# Patient Record
Sex: Male | Born: 1960 | Race: Asian | Hispanic: No | Marital: Married | Smoking: Current some day smoker
Health system: Southern US, Community
[De-identification: ages and names within clinical notes are randomized; demographics above are authoritative.]

---

## 2019-01-02 ENCOUNTER — Telehealth: Payer: BLUE CROSS/BLUE SHIELD | Admitting: Nurse Practitioner

## 2019-01-02 ENCOUNTER — Telehealth: Payer: BLUE CROSS/BLUE SHIELD | Admitting: Physician Assistant

## 2019-01-02 DIAGNOSIS — M7989 Other specified soft tissue disorders: Secondary | ICD-10-CM

## 2019-01-02 DIAGNOSIS — Z20822 Contact with and (suspected) exposure to covid-19: Secondary | ICD-10-CM

## 2019-01-02 DIAGNOSIS — R6889 Other general symptoms and signs: Principal | ICD-10-CM

## 2019-01-02 MED ORDER — BENZONATATE 100 MG PO CAPS: 100.0000 mg | ORAL_CAPSULE | Freq: Three times a day (TID) | ORAL | 0 refills | Status: AC | PRN

## 2019-01-02 NOTE — Progress Notes (Signed)
Based on what you shared with me, I feel your condition warrants further evaluation and I recommend that you be seen for a face to face office visit.     NOTE: If you entered your credit card information for this eVisit, you will not be charged. You may see a "hold" on your card for the $35 but that hold will drop off and you will not have a charge processed.  If you are having a true medical emergency please call 911.  If you need an urgent face to face visit, [facility] has four urgent care centers for your convenience.    PLEASE NOTE: THE [facility] LOCATIONS AND URGENT CARE CLINICS DO NOT HAVE THE TESTING FOR CORONAVIRUS COVID19 AVAILABLE.  IF YOU FEEL YOU NEED THIS TEST YOU MUST GO TO A TRIAGE LOCATION AT [place]  The following sites will take your insurance:  . [facility] [place] a Provider at this Location  [address] [place], [place] [address] . 10 am to 8 pm Monday-Friday . 12 pm to 8 pm Saturday-Sunday   . [facility] at [place] a Provider at this Location  [address], [place] [employer], [place] [address] . 8 am to 8 pm Monday-Friday . 9 am to 6 pm Saturday . 11 am to 6 pm Sunday   . [facility] at [place] Get Driving Directions  [address] [place], [place] [address] . 8 am to 8 pm Monday-Friday . 8 am to 4 pm Saturday-Sunday   Your e-visit answers were reviewed by a board certified advanced clinical practitioner to complete your personal care plan.  Thank you for using e-Visits.   I have spent 5 minutes in review of e-visit questionnaire, review and updating patient chart, medical decision making and response to patient.    Tenna Delaine, PA-C

## 2019-01-02 NOTE — Progress Notes (Signed)
E-Visit for Corona Virus Screening  Based on your current symptoms, it seems unlikely that your symptoms are related to the Coronavirus.   Coronavirus disease 2019 (COVID-19) is a respiratory illness that can spread from person to person. The virus that causes COVID-19 is a new virus that was first identified in the country of [place] but is now found in multiple other countries and has spread to the [place].  Symptoms associated with the virus are mild to severe fever, cough, and shortness of breath. There is currently no vaccine to protect against COVID-19, and there is no specific antiviral treatment for the virus.   To be considered HIGH RISK for Coronavirus (COVID-19), you have to meet the following criteria:  . Traveled to [place], [place], [place], [place] or [place]; or in the [place] to [place], [place], [place], or [place]; and have fever, cough, and shortness of breath within the last 2 weeks of travel OR  . Been in close contact with a person diagnosed with COVID-19 within the last 2 weeks and have fever, cough, and shortness of breath  . IF YOU DO NOT MEET THESE CRITERIA, YOU ARE CONSIDERED LOW RISK FOR COVID-19.   It is vitally important that if you feel that you have an infection such as this virus or any other virus that you stay home and away from places where you may spread it to others.  You should self-quarantine for 14 days if you have symptoms that could potentially be coronavirus and avoid contact with people age 38 and older.   You can use medication such as A prescription cough medication called Tessalon Perles 100 mg. You may take 1-2 capsules every 8 hours as needed for cough  You may also take acetaminophen (Tylenol) as needed for fever.   Reduce your risk of any infection by using the same precautions used for avoiding the common cold or flu:  Marland Kitchen Wash your hands often with soap and warm water for at least 20 seconds.  If soap and water are not readily  available, use an alcohol-based hand sanitizer with at least 60% alcohol.  . If coughing or sneezing, cover your mouth and nose by coughing or sneezing into the elbow areas of your shirt or coat, into a tissue or into your sleeve (not your hands). . Avoid shaking hands with others and consider head nods or verbal greetings only. . Avoid touching your eyes, nose, or mouth with unwashed hands.  . Avoid close contact with people who are sick. . Avoid places or events with large numbers of people in one location, like concerts or sporting events. . Carefully consider travel plans you have or are making. . If you are planning any travel outside or inside the [place], visit the CDC's Travelers' Health webpage for the latest health notices. . If you have some symptoms but not all symptoms, continue to monitor at home and seek medical attention if your symptoms worsen. . If you are having a medical emergency, call 911.  HOME CARE . Only take medications as instructed by your medical team. . Drink plenty of fluids and get plenty of rest. . A steam or ultrasonic humidifier can help if you have congestion.   GET HELP RIGHT AWAY IF: . You develop worsening fever. . You become short of breath . You cough up blood. . Your symptoms become more severe MAKE SURE YOU   Understand these instructions.  Will watch your condition.  Will get help  right away if you are not doing well or get worse.  Your e-visit answers were reviewed by a board certified advanced clinical practitioner to complete your personal care plan.  Depending on the condition, your plan could have included both over the counter or prescription medications.  If there is a problem please reply once you have received a response from your provider. Your safety is important to [place].  If you have drug allergies check your prescription carefully.    You can use MyChart to ask questions about today's visit, request a non-urgent call back, or ask for a  work or school excuse for 24 hours related to this e-Visit. If it has been greater than 24 hours you will need to follow up with your provider, or enter a new e-Visit to address those concerns. You will get an e-mail in the next two days asking about your experience.  I hope that your e-visit has been valuable and will speed your recovery. Thank you for using e-visits.  5-10 minutes spent reviewing and documenting in chart.

## 2019-03-09 ENCOUNTER — Encounter: Payer: Self-pay | Admitting: Gastroenterology

## 2019-03-30 ENCOUNTER — Other Ambulatory Visit: Payer: Self-pay

## 2019-04-07 ENCOUNTER — Encounter: Payer: Self-pay | Admitting: Gastroenterology

## 2019-04-13 ENCOUNTER — Encounter: Payer: BLUE CROSS/BLUE SHIELD | Admitting: Gastroenterology

## 2019-12-21 ENCOUNTER — Ambulatory Visit: Payer: Self-pay | Attending: Internal Medicine

## 2020-04-26 ENCOUNTER — Other Ambulatory Visit: Payer: Self-pay | Admitting: Family

## 2020-04-26 DIAGNOSIS — R109 Unspecified abdominal pain: Secondary | ICD-10-CM

## 2020-04-26 DIAGNOSIS — R221 Localized swelling, mass and lump, neck: Secondary | ICD-10-CM

## 2020-04-26 DIAGNOSIS — R1013 Epigastric pain: Secondary | ICD-10-CM

## 2020-05-17 ENCOUNTER — Ambulatory Visit
Admission: RE | Admit: 2020-05-17 | Discharge: 2020-05-17 | Disposition: A | Discharge status: Home / Self Care | Payer: 59 | Source: Ambulatory Visit | Attending: Family | Admitting: Family

## 2020-05-17 DIAGNOSIS — R109 Unspecified abdominal pain: Secondary | ICD-10-CM

## 2020-05-17 DIAGNOSIS — R221 Localized swelling, mass and lump, neck: Secondary | ICD-10-CM

## 2020-05-17 DIAGNOSIS — R1013 Epigastric pain: Secondary | ICD-10-CM

## 2020-05-19 ENCOUNTER — Other Ambulatory Visit (HOSPITAL_COMMUNITY): Payer: Self-pay | Admitting: "Women's Health Care

## 2020-05-19 ENCOUNTER — Other Ambulatory Visit: Payer: Self-pay | Admitting: "Women's Health Care

## 2020-05-23 ENCOUNTER — Other Ambulatory Visit (HOSPITAL_COMMUNITY): Payer: Self-pay | Admitting: "Women's Health Care

## 2020-05-23 ENCOUNTER — Other Ambulatory Visit: Payer: Self-pay | Admitting: "Women's Health Care

## 2020-05-23 DIAGNOSIS — R1013 Epigastric pain: Secondary | ICD-10-CM

## 2020-05-23 DIAGNOSIS — M7989 Other specified soft tissue disorders: Secondary | ICD-10-CM

## 2020-06-02 ENCOUNTER — Telehealth: Payer: Self-pay | Admitting: Gastroenterology

## 2020-06-02 NOTE — Telephone Encounter (Signed)
Spoke with patient, he states that he received a call regarding [place] results. Advised that he would have to call Judieth Keens, [place] office as she is the one who ordered the imaging. Pt requested that I transfer the call and advised that I am unable because we are not located at the same office. Pt verbalized understanding.

## 2020-07-18 ENCOUNTER — Ambulatory Visit (HOSPITAL_COMMUNITY): Payer: 59

## 2020-07-21 ENCOUNTER — Ambulatory Visit (HOSPITAL_COMMUNITY)
Admission: RE | Admit: 2020-07-21 | Discharge: 2020-07-21 | Disposition: A | Discharge status: Home / Self Care | Payer: 59 | Source: Ambulatory Visit | Attending: "Women's Health Care | Admitting: "Women's Health Care

## 2020-07-21 ENCOUNTER — Other Ambulatory Visit: Payer: Self-pay

## 2020-07-21 DIAGNOSIS — M7989 Other specified soft tissue disorders: Secondary | ICD-10-CM | POA: Insufficient documentation

## 2020-07-21 MED ORDER — IOHEXOL 300 MG/ML  SOLN
75.0000 mL | Freq: Once | INTRAMUSCULAR | Status: AC | PRN
  Administered 2020-07-21: 75 mL via INTRAVENOUS

## 2020-07-26 ENCOUNTER — Ambulatory Visit (HOSPITAL_COMMUNITY): Payer: 59

## 2020-08-10 ENCOUNTER — Ambulatory Visit (HOSPITAL_COMMUNITY): Payer: 59

## 2020-08-23 ENCOUNTER — Encounter (HOSPITAL_COMMUNITY): Payer: 59

## 2020-09-05 ENCOUNTER — Other Ambulatory Visit: Payer: Self-pay

## 2020-09-05 ENCOUNTER — Ambulatory Visit (HOSPITAL_COMMUNITY)
Admission: RE | Admit: 2020-09-05 | Discharge: 2020-09-05 | Disposition: A | Discharge status: Home / Self Care | Payer: 59 | Source: Ambulatory Visit | Attending: "Women's Health Care | Admitting: "Women's Health Care

## 2020-09-05 DIAGNOSIS — R1013 Epigastric pain: Secondary | ICD-10-CM | POA: Diagnosis not present

## 2020-09-05 MED ORDER — TECHNETIUM TC 99M MEBROFENIN IV KIT
5.1000 | PACK | Freq: Once | INTRAVENOUS | Status: AC | PRN
  Administered 2020-09-05: 09:00:00 5.1 via INTRAVENOUS

## 2021-04-04 ENCOUNTER — Ambulatory Visit: Payer: 59

## 2021-04-04 ENCOUNTER — Encounter: Payer: 59 | Admitting: Podiatry

## 2021-04-04 DIAGNOSIS — M722 Plantar fascial fibromatosis: Secondary | ICD-10-CM

## 2021-04-04 NOTE — Progress Notes (Signed)
This encounter was created in error - please disregard.

## 2021-04-05 ENCOUNTER — Encounter: Payer: Self-pay | Admitting: Podiatry

## 2021-09-25 ENCOUNTER — Telehealth: Payer: Self-pay | Admitting: Physician Assistant

## 2021-09-25 NOTE — Telephone Encounter (Signed)
Pt's wife called in to schedule appt from 12/30 referral. Scheduled appt for next available. She is aware of appt date and time.

## 2021-09-27 ENCOUNTER — Telehealth: Payer: Self-pay | Admitting: Hematology and Oncology

## 2021-09-27 NOTE — Telephone Encounter (Signed)
R/s pt's new hem appt per Cassie's 1/18 secure chat. Spoke to pt's wife who is aware of appt date and time.

## 2021-10-05 ENCOUNTER — Other Ambulatory Visit: Payer: Self-pay

## 2021-10-05 ENCOUNTER — Encounter: Payer: Self-pay | Admitting: Physician Assistant

## 2021-10-09 NOTE — Progress Notes (Signed)
[facility] CONSULT NOTE  Patient Care Team: Patient, No Pcp Per (Inactive) as PCP - General (General Practice)  CHIEF COMPLAINTS/PURPOSE OF CONSULTATION:  Newly diagnosed MGUS  HISTORY OF PRESENTING ILLNESS:  Jared Andrews 61 y.o. male is here because of recent diagnosis of MGUS. He presents to the clinic today for initial evaluation and discussion of treatment options.  He was being followed by nephrology for proteinuria and the extensive work-up was performed which included serum protein after pheresis and kappa lambda ratio.  He was referred to [place] because of findings of elevated monoclonal protein and suspicion of MGUS.  He tells me that he retired from [employer] over the past 3 months and does not have any symptoms or concerns.  I reviewed his records extensively and collaborated the history with the patient.  MEDICAL HISTORY:  Proteinuria  SURGICAL HISTORY: No prior surgeries  SOCIAL HISTORY: Tobacco abuse, alcohol use, and denies recreational drug use He is married and his wife brought him Social History   Socioeconomic History   Marital status: Married    Spouse name: Not on file   Number of children: Not on file   Years of education: Not on file   Highest education level: Not on file  Occupational History   Not on file  Tobacco Use   Smoking status: Not on file   Smokeless tobacco: Not on file  Substance and Sexual Activity   Alcohol use: Not on file   Drug use: Not on file   Sexual activity: Not on file  Other Topics Concern   Not on file  Social History Narrative   Not on file   Social Determinants of Health   Financial Resource Strain: Not on file  Food Insecurity: Not on file  Transportation Needs: Not on file  Physical Activity: Not on file  Stress: Not on file  Social Connections: Not on file  Intimate Partner Violence: Not on file    FAMILY HISTORY: No family history of blood cancers  ALLERGIES:  has no allergies on  file.  MEDICATIONS:  Current Outpatient Medications  Medication Sig Dispense Refill   benzonatate (TESSALON PERLES) 100 MG capsule Take 1 capsule (100 mg total) by mouth 3 (three) times daily as needed. 20 capsule 0   No current facility-administered medications for this visit.    REVIEW OF SYSTEMS:   Constitutional: Denies fevers, chills or abnormal night sweats Eyes: Denies blurriness of vision, double vision or watery eyes Ears, nose, mouth, throat, and face: Denies mucositis or sore throat Respiratory: Denies cough, dyspnea or wheezes Cardiovascular: Denies palpitation, chest discomfort or lower extremity swelling Gastrointestinal:  Denies nausea, heartburn or change in bowel habits Skin: Denies abnormal skin rashes Lymphatics: Denies new lymphadenopathy or easy bruising Neurological:Denies numbness, tingling or new weaknesses Behavioral/Psych: Mood is stable, no new changes  All other systems were reviewed with the patient and are negative.  PHYSICAL EXAMINATION: ECOG PERFORMANCE STATUS: 0 - Asymptomatic  Vitals:   10/10/21 1525  BP: 132/77  Pulse: 77  Resp: 18  Temp: 97.7 F (36.5 C)  SpO2: 99%   Filed Weights   10/10/21 1525  Weight: 172 lb (78 kg)       ASSESSMENT AND PLAN:  MGUS (monoclonal gammopathy of unknown significance) Lab review: 03/27/2021: M spike: 0.3 g (IgG lambda), hemoglobin 14.3, IgA 425, IgG 1336, 08/23/2021: M protein 0.4 g, Kappa 357, lambda 33.4, ratio 10.69, IgG 1437 creatinine 1.11, albumin 4.5,, calcium 9.6  Work-up was performed for minimal  proteinuria.  Counseling: I discussed with the patient the spectrum of disorders from MGUS to multiple myeloma. We discussed the role of plasma cells in producing immunoglobulins. We discussed structure of immunoglobulins on how they make up the heavy chains and the light chains. The light chains are Kappa and lambda. I discussed the difference between MGUS and multiple myeloma. MGUS is characterized  by elevation monoclonal protein without any end organ damage. Multiple myeloma is associated with elevation monoclonal protein and end organ damage (hypercalcemia, renal dysfunction, anemia, bone lytic lesions ) along with a bone marrow showing greater than 10% plasma cells.  Surveillance: Since every 49-monthelectrophoresis results did not show any major change, we can now do blood work once a year.  Smoking cessation counseling: Discussed with the patient that quitting smoking and exercising regularly would be important for his overall health. Return to clinic in 1 year for labs and follow-up   All questions were answered. The patient knows to call the clinic with any problems, questions or concerns.   VRulon Eisenmenger MD, MPH 10/10/2021    I, KThana Ates am acting as scribe for VNicholas Lose MD.  I have reviewed the above documentation for accuracy and completeness, and I agree with the above.

## 2021-10-10 ENCOUNTER — Other Ambulatory Visit: Payer: Self-pay

## 2021-10-10 ENCOUNTER — Inpatient Hospital Stay: Payer: 59 | Attending: Physician Assistant | Admitting: Hematology and Oncology

## 2021-10-10 DIAGNOSIS — F17200 Nicotine dependence, unspecified, uncomplicated: Secondary | ICD-10-CM | POA: Diagnosis not present

## 2021-10-10 DIAGNOSIS — D472 Monoclonal gammopathy: Secondary | ICD-10-CM | POA: Insufficient documentation

## 2021-10-10 NOTE — Assessment & Plan Note (Signed)
Lab review: 03/27/2021: M spike: 0.3 g (IgG lambda), hemoglobin 14.3, IgA 425, IgG 1336, 08/23/2021: M protein 0.4 g, Kappa 357, lambda 33.4, ratio 10.69, IgG 1437 creatinine 1.11, albumin 4.5,, calcium 9.6  Work-up was performed for minimal proteinuria.  Counseling: I discussed with the patient the spectrum of disorders from MGUS to multiple myeloma. We discussed the role of plasma cells in producing immunoglobulins. We discussed structure of immunoglobulins on how they make up the heavy chains and the light chains. The light chains are Kappa and lambda. I discussed the difference between MGUS and multiple myeloma. MGUS is characterized by elevation monoclonal protein without any end organ damage. Multiple myeloma is associated with elevation monoclonal protein and end organ damage (hypercalcemia, renal dysfunction, anemia, bone lytic lesions ) along with a bone marrow showing greater than 10% plasma cells.  Workup recommended: Bone survey Return to clinic in 1 year for labs and follow-up

## 2021-11-14 IMAGING — CT CT NECK W/ CM
4 series · 15 of 33 positions shown, 18 images · IV contrast (omnipaque)
Comparison: None.

CLINICAL DATA: Soft tissue nodule

EXAM:
CT NECK WITH CONTRAST
TECHNIQUE: Multidetector CT imaging of the neck was performed using the
standard protocol following the bolus administration of intravenous
contrast.
CONTRAST:  75mL OMNIPAQUE IOHEXOL 300 MG/ML  SOLN

[Series 3: neck 2.0 st · axial · 0.36mm/px · z∈[-360,-224]mm · 5 of 104 slices shown, 7 images]
[im 18/104  soft-tissue]
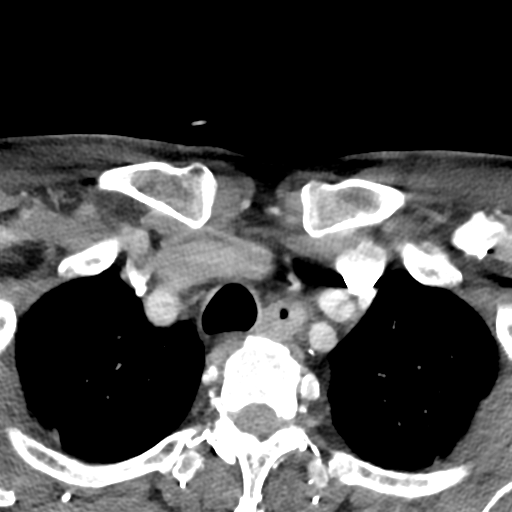
[im 18/104  bone]
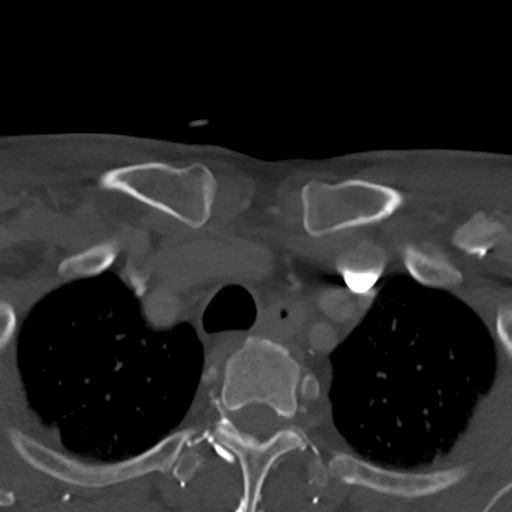
[im 35/104  bone]
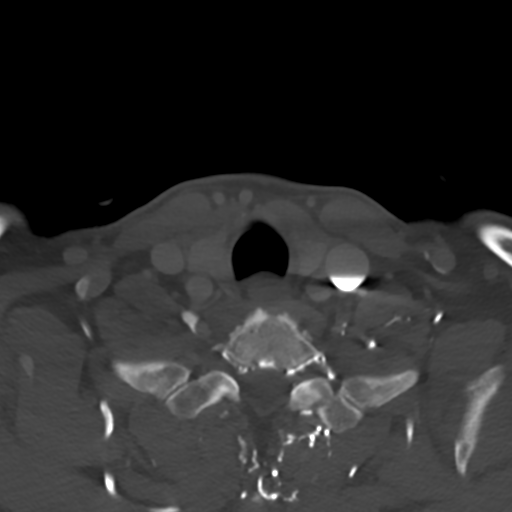
[im 52/104  bone]
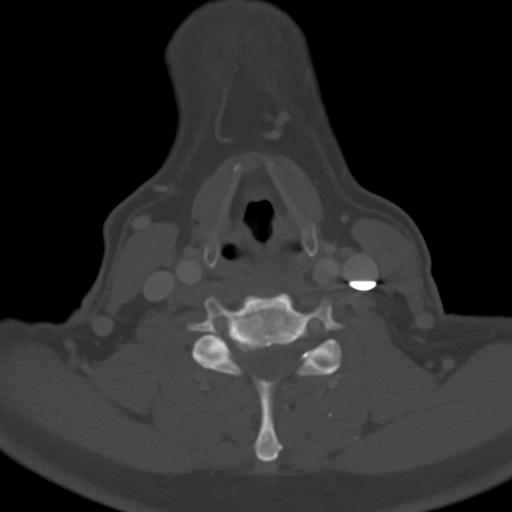
[im 69/104  bone]
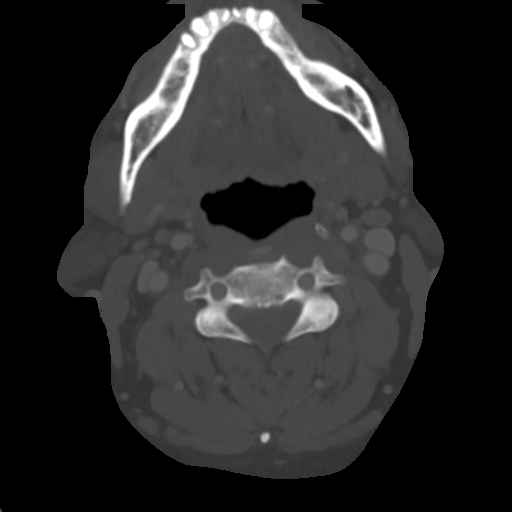
[im 86/104  soft-tissue]
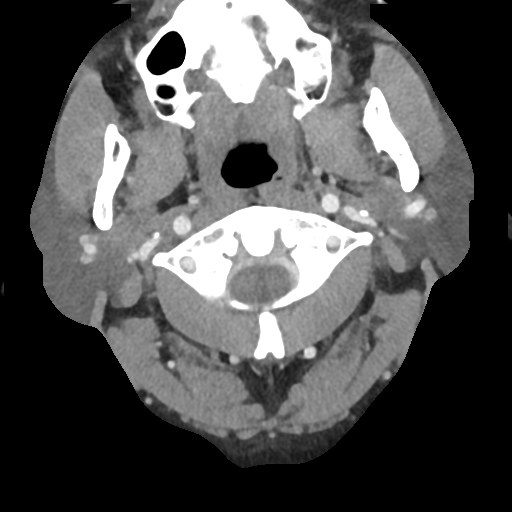
[im 86/104  bone]
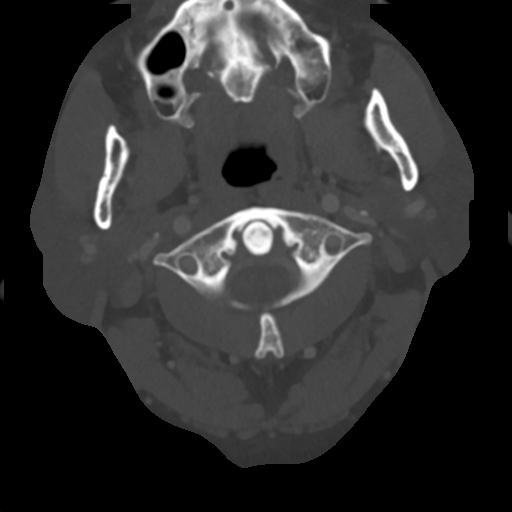

[Series 5: sagittal · sagittal · 0.40mm/px · 5 of 101 slices shown, 6 images]
[im 34/101  bone]
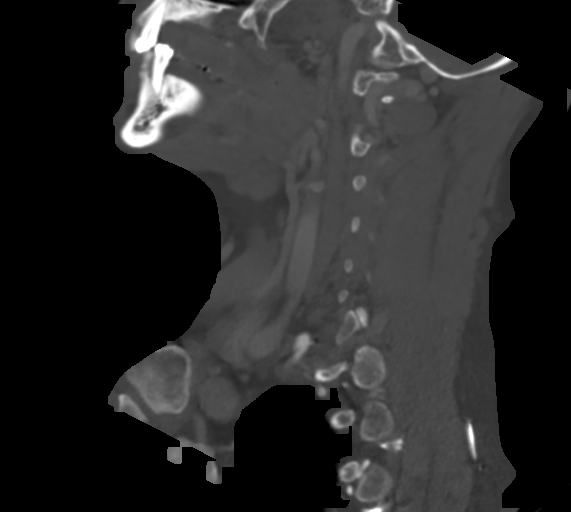
[im 42/101  bone]
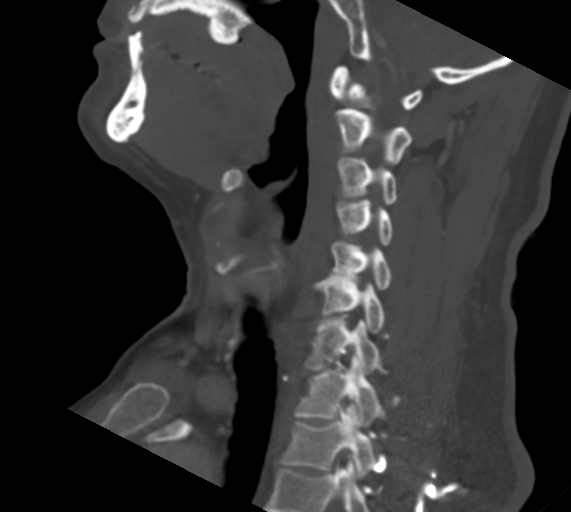
[im 51/101  soft-tissue]
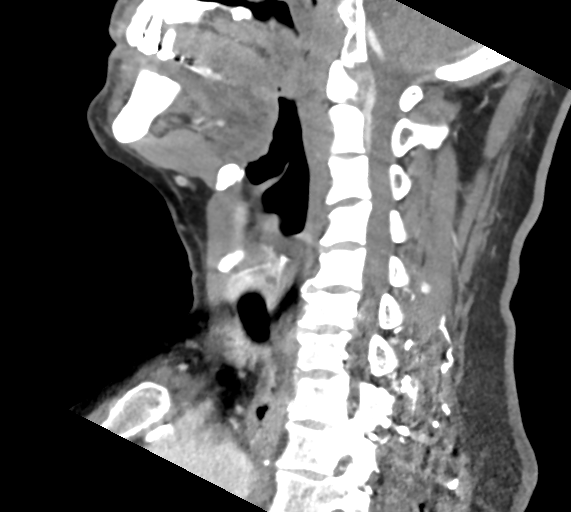
[im 51/101  bone]
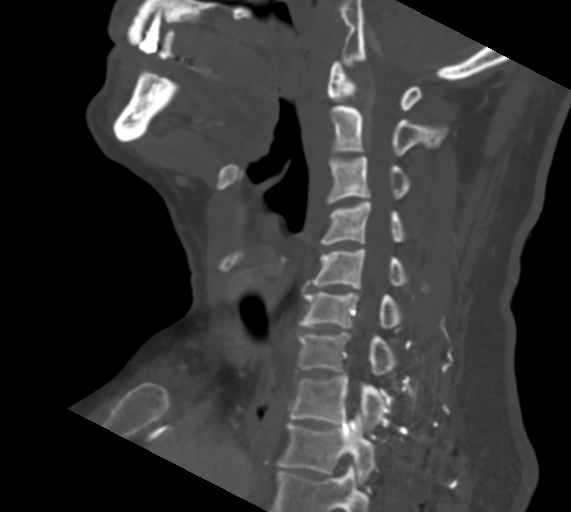
[im 59/101  bone]
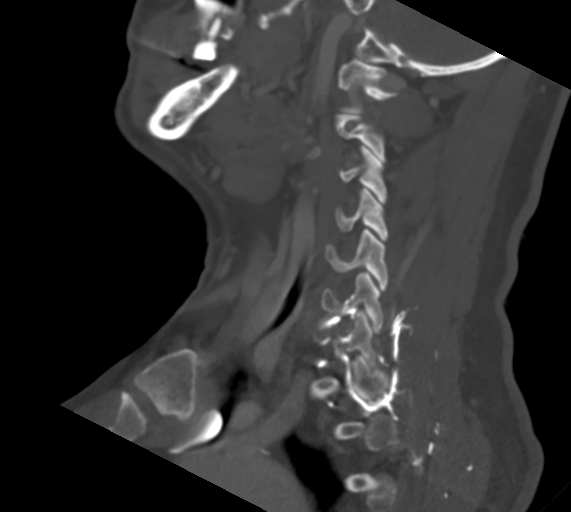
[im 67/101  bone]
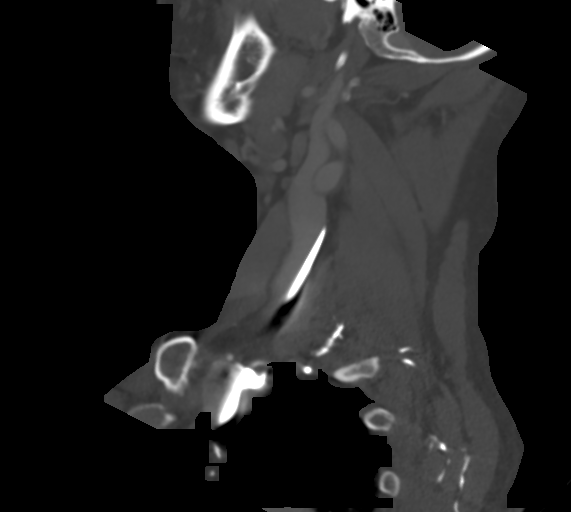

[Series 6: coronal · coronal · 0.40mm/px · 3 of 101 slices shown]
[im 21/101  bone]
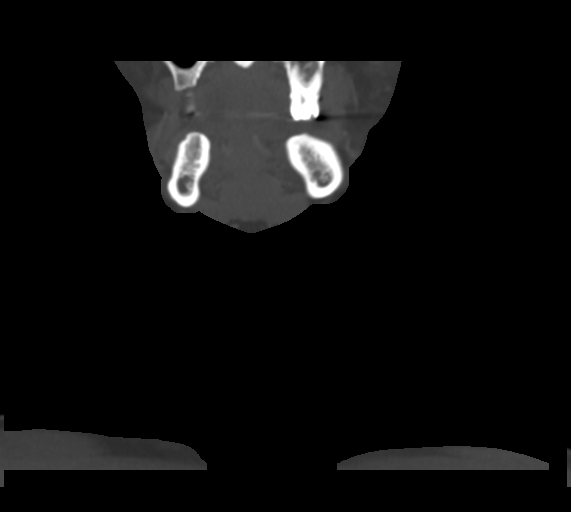
[im 41/101  bone]
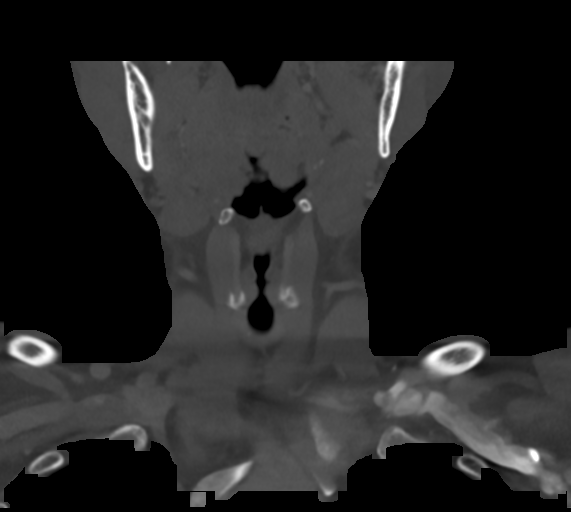
[im 61/101  bone]
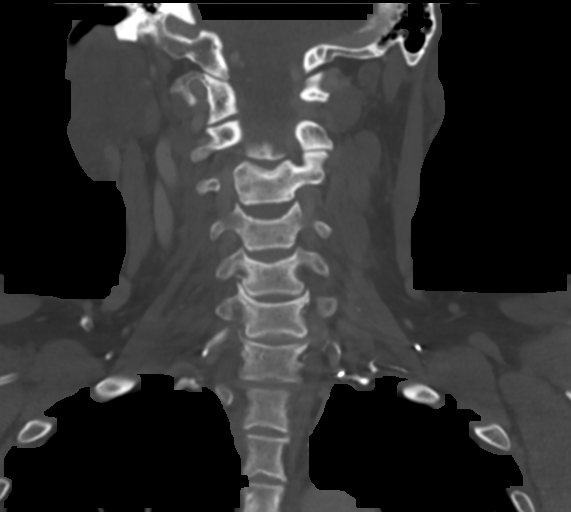

[Series 7: orthogonal · axial · 0.39mm/px · z∈[-358,-324]mm · 2 of 103 slices shown]
[im 18/103  bone]
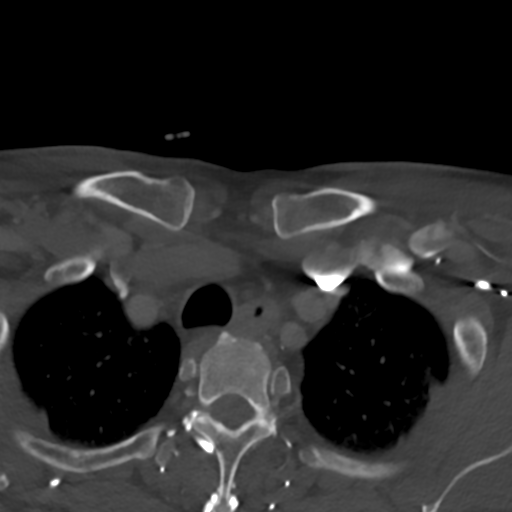
[im 35/103  bone]
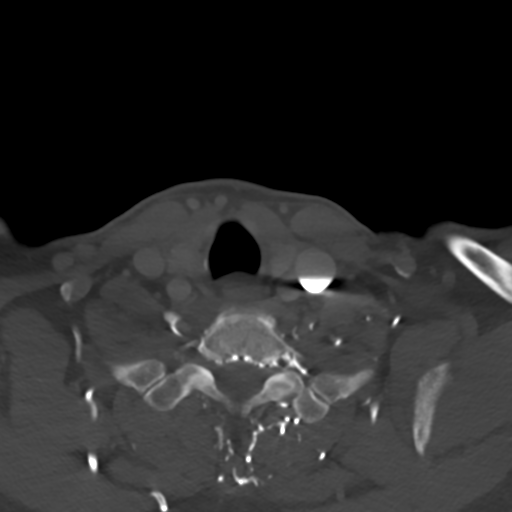

[15 of 33 positions shown; findings below may reference images not displayed]

FINDINGS: PHARYNX AND LARYNX: The nasopharynx, oropharynx and larynx are
normal. Visible portions of the oral cavity, tongue base and floor
of mouth are normal. Normal epiglottis, vallecula and pyriform
sinuses. The larynx is normal. No retropharyngeal abscess, effusion
or lymphadenopathy. Incidentally noted torus palatini.

SALIVARY GLANDS: Normal parotid, submandibular and sublingual
glands.

THYROID: Normal.

LYMPH NODES: No enlarged or abnormal density lymph nodes.

VASCULAR: Major cervical vessels are patent.

LIMITED INTRACRANIAL: Normal.

VISUALIZED ORBITS: Normal.

MASTOIDS AND VISUALIZED PARANASAL SINUSES: No fluid levels or
advanced mucosal thickening. No mastoid effusion.

SKELETON: No bony spinal canal stenosis. No lytic or blastic
lesions.

UPPER CHEST: Clear.

OTHER: There is no focal abnormality that corresponds to the area
examined by ultrasound. The ultrasound finding may indicate an area
of accessory parotid tissue.
IMPRESSION: No focal abnormality that corresponds to the area examined by
ultrasound. The ultrasound finding may indicate an area of accessory
parotid tissue.

## 2021-12-30 IMAGING — NM NM HEPATO W/GB/PHARM/[PERSON_NAME]
2 series · 12 of 12 positions shown · non-contrast
Comparison: None.

CLINICAL DATA: Upper abdominal pain and nausea

EXAM:
NUCLEAR MEDICINE HEPATOBILIARY IMAGING WITH GALLBLADDER EF
VIEWS:
Anterior right upper quadrant
RADIOPHARMACEUTICALS:  5.1 mCi 8c-KKm  Choletec IV

[he hepatobiliary · 4.52mm/px · 6 of 60 frames shown (1 of 2)]
[frame 6/60]
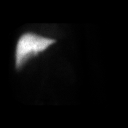
[frame 16/60]
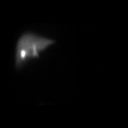
[frame 26/60]
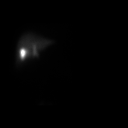
[frame 36/60]
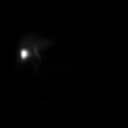
[frame 46/60]
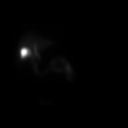
[frame 56/60]
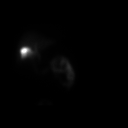

[he hepatobiliary · 4.52mm/px · 6 of 58 frames shown (2 of 2)]
[frame 5/58]
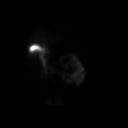
[frame 15/58]
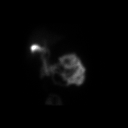
[frame 25/58]
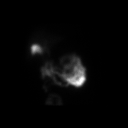
[frame 34/58]
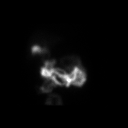
[frame 44/58]
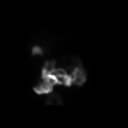
[frame 54/58]
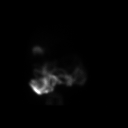

[12 of 12 positions shown; findings below may reference images not displayed]

FINDINGS: Liver uptake of radiotracer is normal. There is prompt visualization
of gallbladder and small bowel, consistent with patency of the
cystic and common bile ducts. Patient consumed 8 ounces of Ensure
orally with calculation of the computer generated ejection fraction
of radiotracer from the gallbladder. No clinical symptoms reported
with the oral Ensure consumption. The computer generated ejection
fraction of radiotracer from the gallbladder is normal at 88%,
normal greater than 33% using the oral agent.
IMPRESSION: Study within normal limits.

## 2022-08-11 ENCOUNTER — Other Ambulatory Visit: Payer: Self-pay

## 2022-08-11 ENCOUNTER — Encounter (HOSPITAL_COMMUNITY): Payer: Self-pay

## 2022-08-11 ENCOUNTER — Emergency Department (HOSPITAL_COMMUNITY)
Admission: EM | Admit: 2022-08-11 | Discharge: 2022-08-11 | Disposition: A | Discharge status: Home / Self Care | Payer: Commercial Managed Care - HMO | Attending: Emergency Medicine | Admitting: Emergency Medicine

## 2022-08-11 ENCOUNTER — Ambulatory Visit: Payer: Self-pay

## 2022-08-11 DIAGNOSIS — L03221 Cellulitis of neck: Secondary | ICD-10-CM | POA: Diagnosis present

## 2022-08-11 LAB — CBC WITH DIFFERENTIAL/PLATELET
Abs Immature Granulocytes: 0.04 10*3/uL (ref 0.00–0.07)
Basophils Absolute: 0.1 10*3/uL (ref 0.0–0.1)
Basophils Relative: 1 %
Eosinophils Absolute: 0.4 10*3/uL (ref 0.0–0.5)
Eosinophils Relative: 5 %
HCT: 45 % (ref 39.0–52.0)
Hemoglobin: 15.7 g/dL (ref 13.0–17.0)
Immature Granulocytes: 1 %
Lymphocytes Relative: 20 %
Lymphs Abs: 1.7 10*3/uL (ref 0.7–4.0)
MCH: 31.7 pg (ref 26.0–34.0)
MCHC: 34.9 g/dL (ref 30.0–36.0)
MCV: 90.7 fL (ref 80.0–100.0)
Monocytes Absolute: 0.6 10*3/uL (ref 0.1–1.0)
Monocytes Relative: 8 %
Neutro Abs: 5.5 10*3/uL (ref 1.7–7.7)
Neutrophils Relative %: 65 %
Platelets: 299 10*3/uL (ref 150–400)
RBC: 4.96 MIL/uL (ref 4.22–5.81)
RDW: 12.6 % (ref 11.5–15.5)
WBC: 8.4 10*3/uL (ref 4.0–10.5)
nRBC: 0 % (ref 0.0–0.2)

## 2022-08-11 LAB — COMPREHENSIVE METABOLIC PANEL
ALT: 12 U/L (ref 0–44)
AST: 17 U/L (ref 15–41)
Albumin: 4.1 g/dL (ref 3.5–5.0)
Alkaline Phosphatase: 46 U/L (ref 38–126)
Anion gap: 8 (ref 5–15)
BUN: 15 mg/dL (ref 8–23)
CO2: 23 mmol/L (ref 22–32)
Calcium: 9.1 mg/dL (ref 8.9–10.3)
Chloride: 108 mmol/L (ref 98–111)
Creatinine, Ser: 1.04 mg/dL (ref 0.61–1.24)
GFR, Estimated: >60 mL/min (ref >60)
Glucose, Bld: 110 mg/dL — ABNORMAL HIGH (ref 70–99)
Potassium: 3.8 mmol/L (ref 3.5–5.1)
Sodium: 139 mmol/L (ref 135–145)
Total Bilirubin: 0.8 mg/dL (ref 0.3–1.2)
Total Protein: 6.8 g/dL (ref 6.5–8.1)

## 2022-08-11 LAB — LACTIC ACID, PLASMA: Lactic Acid, Venous: 1.2 mmol/L (ref 0.5–1.9)

## 2022-08-11 MED ORDER — DOXYCYCLINE HYCLATE 100 MG PO TABS
100.0000 mg | ORAL_TABLET | Freq: Once | ORAL | Status: AC
  Administered 2022-08-11: 100 mg via ORAL
  Filled 2022-08-11: qty 1

## 2022-08-11 MED ORDER — DOXYCYCLINE HYCLATE 100 MG PO CAPS: 100.0000 mg | ORAL_CAPSULE | Freq: Two times a day (BID) | ORAL | 0 refills | Status: AC

## 2022-08-11 NOTE — ED Triage Notes (Signed)
Pt has a small abscess like area at the base of the back of his neck. Pt states it is painful and they called the doctor and they are worried about a blood infection.

## 2022-08-11 NOTE — ED Provider Triage Note (Signed)
Emergency Medicine Provider Triage Evaluation Note  Jared Andrews , a 61 y.o. male  was evaluated in triage.  Pt complains of abscess on back of neck.  States it started on Thursday and getting worse since then.  He called his doctor who sent him to the ED, concerned about possible sepsis.  Denies fever but reports chills at home.  Review of Systems  Positive: Abscess, chills Negative: vomiting  Physical Exam  BP 110/78 (BP Location: Right Arm)   Pulse 78   Temp (!) 97.3 F (36.3 C) (Oral)   Resp 18   Ht '5\' 6"'$  (1.676 m)   Wt 70.8 kg   SpO2 96%   BMI 25.18 kg/m   Gen:   Awake, no distress   Resp:  Normal effort  MSK:   Moves extremities without difficulty  Other:  Abscess noted to posterior neck, scabbed over without active drainage/bleeding, there is surrounding erythema and induration, locally tender to palpation  Medical Decision Making  Medically screening exam initiated at 5:20 AM.  Appropriate orders placed.  Gannon Levett was informed that the remainder of the evaluation will be completed by another provider, this initial triage assessment does not replace that evaluation, and the importance of remaining in the ED until their evaluation is complete.  Abscess.  Sent in by PCP with concern for sepsis.  Will check labs, cultures.   Larene Pickett, PA-C 08/11/22 Delrae Rend

## 2022-08-11 NOTE — ED Provider Notes (Signed)
[facility] EMERGENCY DEPARTMENT Provider Note   CSN: [id number] Arrival date & time: 08/11/22  0454     History  Chief Complaint  Patient presents with   Abscess    Jared Andrews is a 61 y.o. male.  61 year old male with no significant past medical history presents with complaint of concern for spider bite versus abscess to the back of his neck which he first noticed on Friday.  Did not see a spider bite him.  Area became more tender throughout the night, called nurse call line and was advised to go to the emergency room.  Denies fevers, chills, neck stiffness or pain otherwise. Last tetanus less than 5 years ago.       Home Medications Prior to Admission medications   Medication Sig Start Date End Date Taking? Authorizing Provider  doxycycline (VIBRAMYCIN) 100 MG capsule Take 1 capsule (100 mg total) by mouth 2 (two) times daily. 08/11/22  Yes Tacy Learn, PA-C  benzonatate (TESSALON PERLES) 100 MG capsule Take 1 capsule (100 mg total) by mouth 3 (three) times daily as needed. 01/02/19   Chevis Pretty, [place]      Allergies    Patient has no known allergies.    Review of Systems   Review of Systems Negative except as per HPI Physical Exam Updated Vital Signs BP 108/72 (BP Location: Right Arm)   Pulse 70   Temp 98 F (36.7 C) (Oral)   Resp 17   Ht '5\' 6"'$  (1.676 m)   Wt 70.8 kg   SpO2 97%   BMI 25.18 kg/m  Physical Exam Vitals and nursing note reviewed.  Constitutional:      General: He is not in acute distress.    Appearance: He is well-developed. He is not diaphoretic.  HENT:     Head: Normocephalic and atraumatic.  Pulmonary:     Effort: Pulmonary effort is normal.  Skin:    General: Skin is warm and dry.     Findings: Erythema present.     Comments: Raised area to mid posterior neck more along the end of hairline without fluctuance or drainage.  Area is mobile.  Does have lymph node noted to left of this area which daughter notes is  from something unrelated.  This lymph node is mobile, rubbery, nontender.  Neurological:     Mental Status: He is alert and oriented to person, place, and time.  Psychiatric:        Behavior: Behavior normal.     ED Results / Procedures / Treatments   Labs (all labs ordered are listed, but only abnormal results are displayed) Labs Reviewed  COMPREHENSIVE METABOLIC PANEL - Abnormal; Notable for the following components:      Result Value   Glucose, Bld 110 (*)    All other components within normal limits  CULTURE, BLOOD (ROUTINE X 2)  CULTURE, BLOOD (ROUTINE X 2)  CBC WITH DIFFERENTIAL/PLATELET  LACTIC ACID, PLASMA    EKG None  Radiology No results found.  Procedures Procedures    Medications Ordered in ED Medications  doxycycline (VIBRA-TABS) tablet 100 mg (100 mg Oral Given 08/11/22 0758)    ED Course/ Medical Decision Making/ A&P                           Medical Decision Making Risk Prescription drug management.   61 year old male with concern for infection to the back of his neck.  Area is erythematous, tender,  slightly raised without fluctuance or drainage.  Recommend treatment with doxycycline and warm compresses.  Recheck with PCP on Monday if not improving.  Return to the ER through the weekend if worse at any time.  Labs obtained through triage are reassuring including CBC with normal WBC.  CMP which is unremarkable and lactic acid which is 1.2.        Final Clinical Impression(s) / ED Diagnoses Final diagnoses:  Cellulitis of neck    Rx / DC Orders ED Discharge Orders          Ordered    doxycycline (VIBRAMYCIN) 100 MG capsule  2 times daily        08/11/22 0727              Tacy Learn, PA-C 08/11/22 5391    Davonna Belling, MD 08/12/22 [phone]

## 2022-08-11 NOTE — Discharge Instructions (Signed)
Take antibiotics as prescribed. Apply warm compress to area for 20 minutes at a time. Recheck with your doctor Monday if not improving. Return to the ER at any time for worsening or concerning symptoms.

## 2022-08-16 LAB — CULTURE, BLOOD (ROUTINE X 2)
Culture: NO GROWTH
Culture: NO GROWTH
Special Requests: ADEQUATE
Special Requests: ADEQUATE

## 2022-10-10 ENCOUNTER — Inpatient Hospital Stay: Payer: Self-pay | Attending: Hematology and Oncology

## 2022-10-17 ENCOUNTER — Inpatient Hospital Stay: Payer: Self-pay | Admitting: Hematology and Oncology

## 2022-10-17 ENCOUNTER — Telehealth: Payer: Self-pay | Admitting: Hematology and Oncology

## 2022-10-17 NOTE — Assessment & Plan Note (Deleted)
Lab review: 03/27/2021: M spike: 0.3 g (IgG lambda), hemoglobin 14.3, IgA 425, IgG 1336, 08/23/2021: M protein 0.4 g, Kappa 357, lambda 33.4, ratio 10.69, IgG 1437 creatinine 1.11, albumin 4.5,, calcium 9.6  08/11/2022: CBC, CMP: Normal Need to order SPEP and KL ratio Work-up was performed for minimal proteinuria.

## 2022-10-17 NOTE — Telephone Encounter (Signed)
Per 2/7 IB, left msg with wife

## 2022-10-26 NOTE — Progress Notes (Incomplete)
   Patient Care Team: Patient, No Pcp Per as PCP - General (General Practice)  DIAGNOSIS: No diagnosis found.  SUMMARY OF ONCOLOGIC HISTORY: Oncology History   No history exists.    CHIEF COMPLIANT:   INTERVAL HISTORY: Jared Andrews is a   ALLERGIES:  has No Known Allergies.  MEDICATIONS:  Current Outpatient Medications  Medication Sig Dispense Refill   benzonatate (TESSALON PERLES) 100 MG capsule Take 1 capsule (100 mg total) by mouth 3 (three) times daily as needed. 20 capsule 0   doxycycline (VIBRAMYCIN) 100 MG capsule Take 1 capsule (100 mg total) by mouth 2 (two) times daily. 20 capsule 0   No current facility-administered medications for this visit.    PHYSICAL EXAMINATION: ECOG PERFORMANCE STATUS: {CHL ONC ECOG PS:[id number]}  There were no vitals filed for this visit. There were no vitals filed for this visit.  BREAST:*** No palpable masses or nodules in either right or left breasts. No palpable axillary supraclavicular or infraclavicular adenopathy no breast tenderness or nipple discharge. (exam performed in the presence of a chaperone)  LABORATORY DATA:  I have reviewed the data as listed    Latest Ref Rng & Units 08/11/2022    5:33 AM  CMP  Glucose 70 - 99 mg/dL 110   BUN 8 - 23 mg/dL 15   Creatinine 0.61 - 1.24 mg/dL 1.04   Sodium 135 - 145 mmol/L 139   Potassium 3.5 - 5.1 mmol/L 3.8   Chloride 98 - 111 mmol/L 108   CO2 22 - 32 mmol/L 23   Calcium 8.9 - 10.3 mg/dL 9.1   Total Protein 6.5 - 8.1 g/dL 6.8   Total Bilirubin 0.3 - 1.2 mg/dL 0.8   Alkaline Phos 38 - 126 U/L 46   AST 15 - 41 U/L 17   ALT 0 - 44 U/L 12     Lab Results  Component Value Date   WBC 8.4 08/11/2022   HGB 15.7 08/11/2022   HCT 45.0 08/11/2022   MCV 90.7 08/11/2022   PLT 299 08/11/2022   NEUTROABS 5.5 08/11/2022    ASSESSMENT & PLAN:  No problem-specific Assessment & Plan notes found for this encounter.    No orders of the defined types were placed in this  encounter.  The patient has a good understanding of the overall plan. he agrees with it. he will call with any problems that may develop before the next visit here. Total time spent: 30 mins including face to face time and time spent for planning, charting and co-ordination of care   Suzzette Righter, [place] 10/26/22    I Gardiner Coins am acting as a Education administrator for [employer]  ***

## 2022-10-29 ENCOUNTER — Inpatient Hospital Stay: Payer: Self-pay | Admitting: Hematology and Oncology

## 2022-10-29 ENCOUNTER — Other Ambulatory Visit: Payer: Self-pay | Admitting: Hematology and Oncology

## 2022-10-29 ENCOUNTER — Telehealth: Payer: Self-pay

## 2022-10-29 DIAGNOSIS — D472 Monoclonal gammopathy: Secondary | ICD-10-CM

## 2022-10-29 NOTE — Telephone Encounter (Signed)
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 Labs never obtained. Per MD, pt should come next week for labs and have telephone visit one week after . LVM for pt to call back.

## 2023-04-04 ENCOUNTER — Inpatient Hospital Stay: Payer: Self-pay | Attending: Hematology and Oncology

## 2023-04-11 ENCOUNTER — Inpatient Hospital Stay: Payer: Self-pay | Admitting: Hematology and Oncology

## 2023-04-11 ENCOUNTER — Telehealth: Payer: Self-pay

## 2023-04-11 NOTE — Telephone Encounter (Signed)
Called and LVMs to both numbers using [employer] interpreter. Advised Pt that we are cancelling today's MD appt as Pt was a no show to 04/04/23 lab appt. Pt will need to call back to reschedule lab appt and an MD telephone visit 10 days later per MD. Jovita Gamma call back number.

## 2023-04-11 NOTE — Assessment & Plan Note (Signed)
Lab review: 03/27/2021: M spike: 0.3 g (IgG lambda), hemoglobin 14.3, IgA 425, IgG 1336, 08/23/2021: M protein 0.4 g, Kappa 357, lambda 33.4, ratio 10.69, IgG 1437 creatinine 1.11, albumin 4.5,, calcium 9.6   Work-up was performed for minimal proteinuria.

## 2023-04-12 NOTE — Progress Notes (Signed)
This encounter was created in error - please disregard.

## 2023-11-08 ENCOUNTER — Other Ambulatory Visit: Payer: Self-pay

## 2023-11-08 ENCOUNTER — Emergency Department (HOSPITAL_COMMUNITY)
Admission: EM | Admit: 2023-11-08 | Discharge: 2023-11-08 | Disposition: A | Discharge status: Home / Self Care | Payer: BLUE CROSS/BLUE SHIELD | Attending: Emergency Medicine | Admitting: Emergency Medicine

## 2023-11-08 ENCOUNTER — Encounter (HOSPITAL_COMMUNITY): Payer: Self-pay

## 2023-11-08 DIAGNOSIS — H81399 Other peripheral vertigo, unspecified ear: Secondary | ICD-10-CM | POA: Diagnosis not present

## 2023-11-08 DIAGNOSIS — R42 Dizziness and giddiness: Secondary | ICD-10-CM | POA: Diagnosis present

## 2023-11-08 LAB — URINALYSIS, ROUTINE W REFLEX MICROSCOPIC
Bilirubin Urine: NEGATIVE
Glucose, UA: 50 mg/dL — AB
Hgb urine dipstick: NEGATIVE
Ketones, ur: NEGATIVE mg/dL
Leukocytes,Ua: NEGATIVE
Nitrite: NEGATIVE
Protein, ur: NEGATIVE mg/dL
Specific Gravity, Urine: 1.009 (ref 1.005–1.030)
pH: 6 (ref 5.0–8.0)

## 2023-11-08 LAB — CBC
HCT: 46.3 % (ref 39.0–52.0)
Hemoglobin: 15.3 g/dL (ref 13.0–17.0)
MCH: 30.7 pg (ref 26.0–34.0)
MCHC: 33 g/dL (ref 30.0–36.0)
MCV: 93 fL (ref 80.0–100.0)
Platelets: 252 10*3/uL (ref 150–400)
RBC: 4.98 MIL/uL (ref 4.22–5.81)
RDW: 12.3 % (ref 11.5–15.5)
WBC: 5.5 10*3/uL (ref 4.0–10.5)
nRBC: 0 % (ref 0.0–0.2)

## 2023-11-08 LAB — BASIC METABOLIC PANEL
Anion gap: 9 (ref 5–15)
BUN: 16 mg/dL (ref 8–23)
CO2: 25 mmol/L (ref 22–32)
Calcium: 9.6 mg/dL (ref 8.9–10.3)
Chloride: 105 mmol/L (ref 98–111)
Creatinine, Ser: 1.23 mg/dL (ref 0.61–1.24)
GFR, Estimated: >60 mL/min (ref >60)
Glucose, Bld: 104 mg/dL — ABNORMAL HIGH (ref 70–99)
Potassium: 3.7 mmol/L (ref 3.5–5.1)
Sodium: 139 mmol/L (ref 135–145)

## 2023-11-08 LAB — CBG MONITORING, ED: Glucose-Capillary: 103 mg/dL — ABNORMAL HIGH (ref 70–99)

## 2023-11-08 MED ORDER — MECLIZINE HCL 25 MG PO TABS: 25.0000 mg | ORAL_TABLET | Freq: Three times a day (TID) | ORAL | 0 refills | Status: AC | PRN

## 2023-11-08 MED ORDER — MECLIZINE HCL 25 MG PO TABS
25.0000 mg | ORAL_TABLET | Freq: Once | ORAL | Status: AC
  Administered 2023-11-08: 25 mg via ORAL
  Filled 2023-11-08: qty 1

## 2023-11-08 NOTE — ED Triage Notes (Signed)
 Pt presents with a 3 day hx of intermittent dizziness that is worse with going from sitting to standing, but not when turning head from side to side. Denies any HA, CP, SHOB, N/VD.

## 2023-11-08 NOTE — ED Provider Triage Note (Signed)
 Emergency Medicine Provider Triage Evaluation Note  Ragnar Waas , a 63 y.o. male  was evaluated in triage.  Pt complains of dizzy. Room spinning sensation x 3 days, worsen with movement.  Able to ambulate. Denies focal numbness or focal weakness.  No headache, no cp or sob.  No sickness  Review of Systems  Positive: As above Negative: As above  Physical Exam  BP 118/79 (BP Location: Right Arm)   Pulse 75   Temp 98 F (36.7 C) (Oral)   Resp 18   Ht 5\' 7"  (1.702 m)   Wt 72.6 kg   SpO2 95%   BMI 25.06 kg/m  Gen:   Awake, no distress   Resp:  Normal effort  MSK:   Moves extremities without difficulty  Other:    Medical Decision Making  Medically screening exam initiated at 3:03 PM.  Appropriate orders placed.  Neno Templeton was informed that the remainder of the evaluation will be completed by another provider, this initial triage assessment does not replace that evaluation, and the importance of remaining in the ED until their evaluation is complete.     Fayrene Helper, PA-C 11/08/23 1504

## 2023-11-08 NOTE — ED Provider Notes (Signed)
 [place] EMERGENCY DEPARTMENT AT [facility] Provider Note   CSN: [id number] Arrival date & time: 11/08/23  1340     History  Chief Complaint  Patient presents with   Dizziness    Jared Andrews is a 63 y.o. male.  The history is provided by the patient and medical records. No language interpreter was used.  Dizziness    63 year old male presenting with complaints of dizziness.  For the past 3 days patient endorses having intermittent bouts of dizziness.  Initially he described dizziness as room spinning sensation that happen sporadically and brought on by positional change.  Now symptoms seems to be more progressive and he also endorses feeling a bit lightheaded.  He endorsed nausea but denies any severe headache, chest pain, trouble breathing, focal numbness of weakness.  He denies any specific treatment tried at home.  No fever or chills no recent sickness.  Home Medications Prior to Admission medications   Medication Sig Start Date End Date Taking? Authorizing Provider  benzonatate (TESSALON PERLES) 100 MG capsule Take 1 capsule (100 mg total) by mouth 3 (three) times daily as needed. 01/02/19   Daphine Deutscher, Mary-Margaret, FNP  doxycycline (VIBRAMYCIN) 100 MG capsule Take 1 capsule (100 mg total) by mouth 2 (two) times daily. 08/11/22   Jeannie Fend, PA-C      Allergies    Patient has no known allergies.    Review of Systems   Review of Systems  Neurological:  Positive for dizziness.  All other systems reviewed and are negative.   Physical Exam Updated Vital Signs BP 118/79 (BP Location: Right Arm)   Pulse 75   Temp 98 F (36.7 C) (Oral)   Resp 18   Ht 5\' 7"  (1.702 m)   Wt 72.6 kg   SpO2 95%   BMI 25.06 kg/m  Physical Exam Vitals and nursing note reviewed.  Constitutional:      General: He is not in acute distress.    Appearance: He is well-developed.  HENT:     Head: Atraumatic.     Right Ear: Tympanic membrane normal.     Left Ear: Tympanic membrane  normal.  Eyes:     Extraocular Movements: Extraocular movements intact.     Conjunctiva/sclera: Conjunctivae normal.     Pupils: Pupils are equal, round, and reactive to light.     Comments: Mild strabismus noted  Cardiovascular:     Rate and Rhythm: Normal rate and regular rhythm.     Pulses: Normal pulses.     Heart sounds: Normal heart sounds.  Pulmonary:     Effort: Pulmonary effort is normal.     Breath sounds: Normal breath sounds.  Abdominal:     Palpations: Abdomen is soft.     Tenderness: There is no abdominal tenderness.  Musculoskeletal:     Cervical back: Neck supple.     Comments: 5 out of 5 strength to all 4 extremities  Skin:    Findings: No rash.  Neurological:     Mental Status: He is alert and oriented to person, place, and time.     GCS: GCS eye subscore is 4. GCS verbal subscore is 5. GCS motor subscore is 6.     Cranial Nerves: Cranial nerves 2-12 are intact.     Sensory: Sensation is intact.     Motor: Motor function is intact.     Coordination: Coordination is intact.     Comments: Ambulating without difficulty     ED Results /  Procedures / Treatments   Labs (all labs ordered are listed, but only abnormal results are displayed) Labs Reviewed  BASIC METABOLIC PANEL - Abnormal; Notable for the following components:      Result Value   Glucose, Bld 104 (*)    All other components within normal limits  URINALYSIS, ROUTINE W REFLEX MICROSCOPIC - Abnormal; Notable for the following components:   Color, Urine STRAW (*)    Glucose, UA 50 (*)    All other components within normal limits  CBG MONITORING, ED - Abnormal; Notable for the following components:   Glucose-Capillary 103 (*)    All other components within normal limits  CBC    EKG None ED ECG REPORT   Date: 11/08/2023  Rate: 74  Rhythm: normal sinus rhythm  QRS Axis: normal  Intervals: normal  ST/T Wave abnormalities: nonspecific ST changes  Conduction Disutrbances:none  Narrative  Interpretation:   Old EKG Reviewed: none available  I have personally reviewed the EKG tracing and agree with the computerized printout as noted.   Radiology No results found.  Procedures Procedures    Medications Ordered in ED Medications  meclizine (ANTIVERT) tablet 25 mg (25 mg Oral Given 11/08/23 1509)    ED Course/ Medical Decision Making/ A&P                                 Medical Decision Making Amount and/or Complexity of Data Reviewed Labs: ordered.   BP 118/79 (BP Location: Right Arm)   Pulse 75   Temp 98 F (36.7 C) (Oral)   Resp 18   Ht 5\' 7"  (1.702 m)   Wt 72.6 kg   SpO2 95%   BMI 25.06 kg/m   64:34 PM  63 year old male presenting with complaints of dizziness.  For the past 3 days patient endorses having intermittent bouts of dizziness.  Initially he described dizziness as room spinning sensation that happen sporadically and brought on by positional change.  Now symptoms seems to be more progressive and he also endorses feeling a bit lightheaded.  He endorsed nausea but denies any severe headache, chest pain, trouble breathing, focal numbness of weakness.  He denies any specific treatment tried at home.  No fever or chills no recent sickness.  Exam overall reassuring no focal neurodeficit, strength equal throughout, no concerning nystagmus no cardiac arrhythmia  Vital signs overall reassuring no fever no hypoxia normal blood pressure.  -Labs ordered, independently viewed and interpreted by me.  Labs remarkable for reassuring labs -The patient was maintained on a cardiac monitor.  I personally viewed and interpreted the cardiac monitored which showed an underlying rhythm of: NSR -Imaging including brain MRI considered, but low suspicion for posterior circulation stroke -This patient presents to the ED for concern of dizzy, this involves an extensive number of treatment options, and is a complaint that carries with it a high risk of complications and  morbidity.  The differential diagnosis includes peripheral vertigo, anemia, cardiac arrhythmia, hypovolemia, electrolytes derangement, stroke -Co morbidities that complicate the patient evaluation includes none -Treatment includes meclizine -Reevaluation of the patient after these medicines showed that the patient improved -PCP office notes or outside notes reviewed -Escalation to admission/observation considered: patients feels much better, is comfortable with discharge, and will follow up with PCP -Prescription medication considered, patient comfortable with meclizine -Social Determinant of Health considered which includes tobacco use  Patient without finding concerning for posterior circulation stroke.  He  ambulates without a fatigue, he feels much better after meclizine, labs are overall reassuring and at this time he is stable for discharge.  Return precaution given.         Final Clinical Impression(s) / ED Diagnoses Final diagnoses:  Peripheral vertigo, unspecified laterality    Rx / DC Orders ED Discharge Orders          Ordered    meclizine (ANTIVERT) 25 MG tablet  3 times daily PRN        11/08/23 1630              Fayrene Helper, PA-C 11/08/23 1632    Loetta Rough, MD 11/08/23 1944
